# Patient Record
Sex: Male | Born: 1935 | Race: White | Hispanic: No | Smoking: Former smoker
Health system: Southern US, Community
[De-identification: ages and names within clinical notes are randomized; demographics above are authoritative.]

## PROBLEM LIST (undated history)

## (undated) DIAGNOSIS — I10 Essential (primary) hypertension: Secondary | ICD-10-CM

## (undated) DIAGNOSIS — E119 Type 2 diabetes mellitus without complications: Secondary | ICD-10-CM

## (undated) HISTORY — PX: SHOULDER SURGERY: SHX246

---

## 2017-05-13 ENCOUNTER — Emergency Department (INDEPENDENT_AMBULATORY_CARE_PROVIDER_SITE_OTHER)
Admission: EM | Admit: 2017-05-13 | Discharge: 2017-05-13 | Disposition: A | Discharge status: Home / Self Care | Payer: Medicare Other | Source: Home / Self Care

## 2017-05-13 ENCOUNTER — Encounter: Payer: Self-pay | Admitting: Emergency Medicine

## 2017-05-13 ENCOUNTER — Other Ambulatory Visit: Payer: Self-pay

## 2017-05-13 DIAGNOSIS — Z23 Encounter for immunization: Secondary | ICD-10-CM | POA: Diagnosis not present

## 2017-05-13 DIAGNOSIS — S0081XA Abrasion of other part of head, initial encounter: Secondary | ICD-10-CM

## 2017-05-13 DIAGNOSIS — S61210A Laceration without foreign body of right index finger without damage to nail, initial encounter: Secondary | ICD-10-CM

## 2017-05-13 HISTORY — DX: Type 2 diabetes mellitus without complications: E11.9

## 2017-05-13 HISTORY — DX: Essential (primary) hypertension: I10

## 2017-05-13 MED ORDER — TETANUS-DIPHTH-ACELL PERTUSSIS 5-2.5-18.5 LF-MCG/0.5 IM SUSP
0.5000 mL | Freq: Once | INTRAMUSCULAR | Status: AC
  Administered 2017-05-13: 0.5 mL via INTRAMUSCULAR

## 2017-05-13 NOTE — ED Provider Notes (Signed)
[facility]    CSN: [id number] Arrival date & time: 05/13/17  9629     History   Chief Complaint Chief Complaint  Patient presents with  . Laceration    HPI Raymond Morgan is a 84 y.o. male.   About two hours ago patient was pumping up a small cart tire when it exploded.  Debris hit his right cheek and nose, and lacerated the dorsum of his right second finger.  He is not sure when he received his last Tdap.  The history is provided by the patient and a relative.  Laceration  Location:  Finger Finger laceration location:  R index finger Length:  1cm Depth:  Through dermis Bleeding: controlled   Laceration mechanism:  Metal edge Pain details:    Quality:  Aching   Severity:  Mild   Timing:  Constant   Progression:  Improving Foreign body present:  No foreign bodies Ineffective treatments:  None tried Tetanus status:  Out of date Associated symptoms: no focal weakness and no swelling     Past Medical History:  Diagnosis Date  . Diabetes mellitus without complication (HCC)   . Hypertension     There are no active problems to display for this patient.   Past Surgical History:  Procedure Laterality Date  . SHOULDER SURGERY         Home Medications    Prior to Admission medications   Medication Sig Start Date End Date Taking? Authorizing Provider  aspirin 81 MG chewable tablet Chew by mouth daily.   Yes [provider]  atorvastatin (LIPITOR) 10 MG tablet Take 10 mg by mouth daily.   Yes [provider]  glipiZIDE (GLUCOTROL XL) 2.5 MG 24 hr tablet Take 2.5 mg by mouth daily with breakfast.   Yes [provider]  lisinopril (PRINIVIL,ZESTRIL) 40 MG tablet Take 40 mg by mouth daily.   Yes [provider]  magnesium 30 MG tablet Take 30 mg by mouth 2 (two) times daily.   Yes [provider]  meclizine (ANTIVERT) 25 MG tablet Take 25 mg by mouth 3 (three) times daily as needed for dizziness.   Yes [provider]  verapamil (VERELAN PM) 240 MG 24 hr capsule Take 240 mg by mouth at bedtime.   Yes [provider]    Family History No family history on file.  Social History Social History   Tobacco Use  . Smoking status: Former Smoker    Last attempt to quit: 05/14/2007    Years since quitting: 10.0  . Smokeless tobacco: Never Used  Substance Use Topics  . Alcohol use: Yes  . Drug use: Never     Allergies   Patient has no known allergies.   Review of Systems Review of Systems  HENT:       Right facial abrasion and pain.  Eyes: Negative for visual disturbance.  Neurological: Negative for dizziness, focal weakness, light-headedness and headaches.  All other systems reviewed and are negative.    Physical Exam Triage Vital Signs ED Triage Vitals  Enc Vitals Group     BP      Pulse      Resp      Temp      Temp src      SpO2      Weight      Height      Head Circumference      Peak Flow      Pain Score  Pain Loc      Pain Edu?      Excl. in GC?    No data found.  Updated Vital Signs BP (!) 156/68 (BP Location: Right Arm)   Pulse (!) 58   Temp (!) 97.5 F (36.4 C) (Oral)   Ht  (1.702 m)   Wt 173 lb (78.5 kg)   SpO2 99%   BMI 27.10 kg/m   Visual Acuity Right Eye Distance:   Left Eye Distance:   Bilateral Distance:    Right Eye Near:   Left Eye Near:    Bilateral Near:     Physical Exam  Constitutional: He is oriented to person, place, and time. He appears well-developed and well-nourished. No distress.  HENT:  Head: Head is with abrasion. Head is without raccoon's eyes.    Right Ear: External ear normal.  Left Ear: External ear normal.  Nose: Nose normal.  Mouth/Throat: Oropharynx is clear and moist.  Mild abrasion right cheek and nose.  No swelling.  No tenderness over facial bone  Eyes: Pupils are equal, round, and reactive to light. Conjunctivae and EOM are normal.  Neck: Normal range of motion.  Cardiovascular:  Normal rate.  Pulmonary/Chest: Effort normal.  Musculoskeletal:       Right hand: He exhibits laceration. He exhibits normal range of motion, no tenderness, no bony tenderness, normal two-point discrimination, normal capillary refill, no deformity and no swelling. Normal sensation noted. Normal strength noted.       Hands: Right second finger has 1cm simple laceration dorsally over PIP joint.  Finger has full range of motion; flexion/extension intact.  Distal neurovascular function is intact.   Neurological: He is alert and oriented to person, place, and time.  Skin: Skin is warm and dry.  Nursing note and vitals reviewed.    UC Treatments / Results  Labs (all labs ordered are listed, but only abnormal results are displayed) Labs Reviewed - No data to display  EKG None  Radiology No results found.  Procedures Procedures  Laceration Repair Discussed benefits and risks of procedure and verbal consent obtained. Using sterile technique and digital anesthesia with 2% lidocaine without epinephrine, cleansed wound with Betadine followed by copious lavage with normal saline.  Wound carefully inspected for debris and foreign bodies; none found.  Wound closed with # , 5-0 interrupted nylon sutures.  Bacitracin and non-stick sterile dressing applied.  Wound precautions explained to patient.  Return for suture removal in 10 days.   Medications Ordered in UC Medications - No data to display  Initial Impression / Assessment and Plan / UC Course  I have reviewed the triage vital signs and the nursing notes.  Pertinent labs & imaging results that were available during my care of the patient were reviewed by me and considered in my medical decision making (see chart for details).    Administered Tdap. Discussed head injury precautions.   Final Clinical Impressions(s) / UC Diagnoses   Final diagnoses:  Abrasion, face without infection  Laceration of right index finger without foreign body  without damage to nail, initial encounter     Discharge Instructions     Apply ice pack to face for 10 minutes every 2 hours until swelling decreases.  Apply Bacitracin ointment (or other antibiotic ointment) to abrasions face daily until healed.  Change dressing daily and apply Bacitracin ointment to wound.  Keep wound clean and dry.  Return for any signs of infection (or follow-up with family doctor):  Increasing redness,  swelling, pain, heat, drainage, etc. Return in 10 days for suture removal.      ED Prescriptions    None         Lattie Haw, MD 05/20/17 1430

## 2017-05-13 NOTE — Discharge Instructions (Addendum)
Apply ice pack to face for 10 minutes every 2 hours until swelling decreases.  Apply Bacitracin ointment (or other antibiotic ointment) to abrasions face daily until healed.  Change dressing daily and apply Bacitracin ointment to wound.  Keep wound clean and dry.  Return for any signs of infection (or follow-up with family doctor):  Increasing redness, swelling, pain, heat, drainage, etc. Return in 10 days for suture removal.

## 2017-05-13 NOTE — ED Triage Notes (Signed)
Right index finger laceration. Abrasion to nose and right cheek. Pumping a small tire and it blow up cutting top of knuckle and hitting him on the tip of his nose and right cheek

## 2018-11-24 ENCOUNTER — Other Ambulatory Visit: Payer: Self-pay

## 2018-11-24 ENCOUNTER — Emergency Department (INDEPENDENT_AMBULATORY_CARE_PROVIDER_SITE_OTHER)
Admission: EM | Admit: 2018-11-24 | Discharge: 2018-11-24 | Disposition: A | Discharge status: Home / Self Care | Payer: Medicare Other | Source: Home / Self Care | Attending: Family Medicine | Admitting: Family Medicine

## 2018-11-24 ENCOUNTER — Emergency Department (INDEPENDENT_AMBULATORY_CARE_PROVIDER_SITE_OTHER): Payer: Medicare Other

## 2018-11-24 ENCOUNTER — Encounter: Payer: Self-pay | Admitting: Emergency Medicine

## 2018-11-24 DIAGNOSIS — M869 Osteomyelitis, unspecified: Secondary | ICD-10-CM

## 2018-11-24 DIAGNOSIS — M542 Cervicalgia: Secondary | ICD-10-CM

## 2018-11-24 DIAGNOSIS — M62838 Other muscle spasm: Secondary | ICD-10-CM

## 2018-11-24 DIAGNOSIS — R102 Pelvic and perineal pain: Secondary | ICD-10-CM | POA: Diagnosis not present

## 2018-11-24 MED ORDER — PREDNISONE 20 MG PO TABS: ORAL_TABLET | ORAL | 0 refills | Status: AC

## 2018-11-24 MED ORDER — HYDROCODONE-ACETAMINOPHEN 5-325 MG PO TABS: ORAL_TABLET | ORAL | 0 refills | Status: AC

## 2018-11-24 MED ORDER — ACETAMINOPHEN 325 MG PO TABS
975.0000 mg | ORAL_TABLET | Freq: Once | ORAL | Status: AC
  Administered 2018-11-24: 15:00:00 975 mg via ORAL

## 2018-11-24 NOTE — ED Triage Notes (Signed)
Patient is hard of hearing; daughter is with him to help with history. He fell asleep in chair last night and now has pain in left side of neck and shoulder which is significant when he moves head; no pain when still. He took aspirin at 0600. He had influenza vacc this season. He has not travelled or been in contact with COVID positive person.

## 2018-11-24 NOTE — Discharge Instructions (Addendum)
Apply ice pack to neck for 20 to 30 minutes, 3 to 4 times daily  Continue until pain decreases.  Try wearing a soft cervical collar, especially at night.  Begin neck range of motion and stretching exercises as tolerated.

## 2018-11-24 NOTE — ED Provider Notes (Signed)
[facility]    CSN: [id number] Arrival date & time: 11/24/18  1212      History   Chief Complaint Chief Complaint  Patient presents with  . Neck Pain  . Shoulder Pain    HPI Raymond Morgan is a 85 y.o. male.   Patient complains of vague tingling in his right arm and hand for about 4 to 5 days.  Last night he fell asleep in a chair in an awkward position, and today awoke with pain in his left neck worse with any head movement.  He also complains of approximately  6 week history of bilateral groin pain, worse when walking and climbing stairs.  He denies pain or swelling in testicles, and no abdominal pain.  The history is provided by the patient and a relative.    Past Medical History:  Diagnosis Date  . Diabetes mellitus without complication ([place])   . Hypertension     Active problems:  Hyperlipidemia, hypertension, and diabetes   Past Surgical History:  Procedure Laterality Date  . SHOULDER SURGERY         Home Medications    Prior to Admission medications   Medication Sig Start Date End Date Taking? Authorizing Provider  nitroGLYCERIN (NITROSTAT) 0.4 MG SL tablet Place 0.4 mg under the tongue every 5 (five) minutes as needed for chest pain.   Yes [provider]  aspirin 81 MG chewable tablet Chew by mouth daily.    [provider]  atorvastatin (LIPITOR) 10 MG tablet Take 10 mg by mouth daily.    [provider]  glipiZIDE (GLUCOTROL XL) 2.5 MG 24 hr tablet Take 2.5 mg by mouth daily with breakfast.    [provider]  HYDROcodone-acetaminophen (NORCO/VICODIN) 5-325 MG tablet Take one by mouth at bedtime as needed for pain 11/24/18   Kandra Nicolas, MD  lisinopril (PRINIVIL,ZESTRIL) 40 MG tablet Take 40 mg by mouth daily.    [provider]  magnesium 30 MG tablet Take 30 mg by mouth 2 (two) times daily.    [provider]  meclizine (ANTIVERT) 25 MG tablet Take 25 mg by mouth 3 (three) times daily  as needed for dizziness.    [provider]  predniSONE (DELTASONE) 20 MG tablet Take one tab by mouth twice daily for 4 days, then one daily. Take with food. 11/24/18   Kandra Nicolas, MD  verapamil (VERELAN PM) 240 MG 24 hr capsule Take 240 mg by mouth at bedtime.    [provider]    Family History Not recorded by patient  Social History Social History   Tobacco Use  . Smoking status: Former Smoker    Quit date: 05/14/2007    Years since quitting: 11.5  . Smokeless tobacco: Never Used  Substance Use Topics  . Alcohol use: Yes  . Drug use: Never     Allergies   Patient has no known allergies.   Review of Systems Review of Systems  Constitutional: Negative for activity change, appetite change, chills, diaphoresis, fatigue and fever.  HENT: Negative.   Eyes: Negative.   Respiratory: Negative.   Cardiovascular: Negative.   Gastrointestinal: Negative.   Genitourinary: Negative.   Musculoskeletal: Positive for neck pain.       Bilateral groin pain.  Skin: Negative for rash.  Neurological: Negative for tremors, syncope, facial asymmetry, speech difficulty, weakness, numbness and headaches.       Paresthesias right arm.     Physical Exam Triage Vital Signs  ED Triage Vitals  Enc Vitals Group     BP 11/24/18 1259 (!) 152/70     Pulse Rate 11/24/18 1259 77     Resp 11/24/18 1259 16     Temp 11/24/18 1259 97.9 F (36.6 C)     Temp Source 11/24/18 1259 Oral     SpO2 11/24/18 1259 97 %     Weight 11/24/18 1300 175 lb (79.4 kg)     Height 11/24/18 1300 5' 7.5" (1.715 m)     Head Circumference --      Peak Flow --      Pain Score 11/24/18 1300 9     Pain Loc --      Pain Edu? --      Excl. in GC? --    No data found.  Updated Vital Signs BP (!) 152/70 (BP Location: Right Arm)   Pulse 77   Temp 97.9 F (36.6 C) (Oral)   Resp 16   Ht 5' 7.5" (1.715 m)   Wt 79.4 kg   SpO2 97%   BMI 27.00 kg/m   Visual Acuity Right Eye Distance:   Left  Eye Distance:   Bilateral Distance:    Right Eye Near:   Left Eye Near:    Bilateral Near:     Physical Exam Vitals signs and nursing note reviewed.  Constitutional:      General: He is not in acute distress.    Comments: Patient has difficulty hearing.  HENT:     Head: Atraumatic.     Right Ear: External ear normal.     Left Ear: External ear normal.     Nose: Nose normal.     Mouth/Throat:     Pharynx: Oropharynx is clear.  Eyes:     Conjunctiva/sclera: Conjunctivae normal.     Pupils: Pupils are equal, round, and reactive to light.  Neck:     Musculoskeletal: Neck supple. Pain with movement and muscular tenderness present. No crepitus.      Comments: There is tenderness to palpation over left trapezius and sternocleidomastoid muscles. Cardiovascular:     Heart sounds: Normal heart sounds.  Pulmonary:     Breath sounds: Normal breath sounds.  Abdominal:     Tenderness: There is no abdominal tenderness.  Musculoskeletal:       Legs:     Comments: Patient has distinct tenderness over symphysis pubis bilaterally.  Palpation there during resisted lateral adduction, and flexion of hips, recreates his groin pain.  Lymphadenopathy:     Cervical: No cervical adenopathy.  Skin:    General: Skin is warm and dry.  Neurological:     Mental Status: He is alert.      UC Treatments / Results  Labs (all labs ordered are listed, but only abnormal results are displayed) Labs Reviewed - No data to display  EKG   Radiology Dg Cervical Spine Complete  Result Date: 11/24/2018 CLINICAL DATA:  Left-sided neck pain and stiffness since yesterday, worse with movement. No reported injury. EXAM: CERVICAL SPINE - COMPLETE 4+ VIEW COMPARISON:  None. FINDINGS: No fracture or bone lesion. Mild kyphosis, apex at C5. Moderate loss of disc height at C5-C6 with moderate to marked loss of disc height at C6-C7. There are facet degenerative changes, greatest on the right at C4-C5 Soft tissues are  unremarkable. IMPRESSION: 1. No fracture, bone lesion or acute finding. 2. Disc and facet degenerative changes. Electronically Signed   By: Amie Portlandavid  Ormond M.D.   On: 11/24/2018 14:17  Dg Pelvis 1-2 Views  Result Date: 11/24/2018 CLINICAL DATA:  Bilateral groin pain for approximately 6 weeks. No reported trauma. EXAM: PELVIS - 1-2 VIEW COMPARISON:  None. FINDINGS: No fracture or bone lesion. Hip joints, SI joints and symphysis pubis are normally spaced and aligned. No significant arthropathic change. Soft tissues are unremarkable. IMPRESSION: Negative. Electronically Signed   By: Amie Portland M.D.   On: 11/24/2018 14:15    Procedures Procedures (including critical care time)  Medications Ordered in UC Medications  acetaminophen (TYLENOL) tablet 975 mg (has no administration in time range)    Initial Impression / Assessment and Plan / UC Course  I have reviewed the triage vital signs and the nursing notes.  Pertinent labs & imaging results that were available during my care of the patient were reviewed by me and considered in my medical decision making (see chart for details).    Suspect right cervical radiculopathy, and acute left neck spasm.  Begin prednisone burst/taper.  Rx Lortab, one at bedtime PRN (Rx #5, no refill). Controlled Substance Prescriptions I have consulted the [place] Controlled Substances Registry for this patient, and feel the risk/benefit ratio today is favorable for proceeding with this prescription for a controlled substance.   Followup with Dr. [facility] ([facility]) for further management.  Final Clinical Impressions(s) / UC Diagnoses   Final diagnoses:  Neck muscle spasm  Osteitis pubis (HCC)     Discharge Instructions     Apply ice pack to neck for 20 to 30 minutes, 3 to 4 times daily  Continue until pain decreases.  Try wearing a soft cervical collar, especially at night.  Begin neck range of motion and stretching exercises as  tolerated.    ED Prescriptions    Medication Sig Dispense Auth. Provider   predniSONE (DELTASONE) 20 MG tablet Take one tab by mouth twice daily for 4 days, then one daily. Take with food. 12 tablet Lattie Haw, MD   HYDROcodone-acetaminophen (NORCO/VICODIN) 5-325 MG tablet Take one by mouth at bedtime as needed for pain 5 tablet Cathren Harsh Tera Mater, MD        Lattie Haw, MD 11/26/18 2139

## 2019-11-02 ENCOUNTER — Other Ambulatory Visit (HOSPITAL_BASED_OUTPATIENT_CLINIC_OR_DEPARTMENT_OTHER): Payer: Self-pay | Admitting: Internal Medicine

## 2019-11-02 ENCOUNTER — Ambulatory Visit: Payer: PRIVATE HEALTH INSURANCE | Attending: Internal Medicine

## 2019-11-02 DIAGNOSIS — Z23 Encounter for immunization: Secondary | ICD-10-CM

## 2019-11-02 NOTE — Progress Notes (Signed)
   Covid-19 Vaccination Clinic  Name:  Raymond Morgan    MRN: [id number] DOB: May 10, 1932  11/02/2019  [name] was observed post Covid-19 immunization for 15 minutes without incident. He was provided with Vaccine Information Sheet and instruction to access the V-Safe system.   [name] was instructed to call 911 with any severe reactions post vaccine: Marland Kitchen Difficulty breathing  . Swelling of face and throat  . A fast heartbeat  . A bad rash all over body  . Dizziness and weakness

## 2019-11-06 MED FILL — PFIZER-BIONTECH COVID-19 VA: 30 | 1 days supply | Qty: 0 | Fill #0

## 2019-12-21 ENCOUNTER — Other Ambulatory Visit (HOSPITAL_BASED_OUTPATIENT_CLINIC_OR_DEPARTMENT_OTHER): Payer: Self-pay | Admitting: Internal Medicine

## 2019-12-21 ENCOUNTER — Ambulatory Visit: Payer: PRIVATE HEALTH INSURANCE | Attending: Internal Medicine

## 2019-12-21 DIAGNOSIS — Z23 Encounter for immunization: Secondary | ICD-10-CM

## 2019-12-21 NOTE — Progress Notes (Signed)
   Covid-19 Vaccination Clinic  Name:  Deaglan Lile    MRN: [id number] DOB: 1932-02-17  12/21/2019  Mr. Stoney was observed post Covid-19 immunization for 15 minutes without incident. He was provided with Vaccine Information Sheet and instruction to access the V-Safe system.   Mr. Lyon was instructed to call 911 with any severe reactions post vaccine: Marland Kitchen Difficulty breathing  . Swelling of face and throat  . A fast heartbeat  . A bad rash all over body  . Dizziness and weakness   Immunizations Administered    Name Date Dose VIS Date Route   Pfizer COVID-19 Vaccine 12/21/2019  1:35 PM 0.3 mL 10/24/2019 Intramuscular   Manufacturer: [employer], [employer]   Lot: [id number]   NDC: [id number]

## 2019-12-24 MED FILL — PFIZER-BIONTECH COVID-19 VA: 30 | 1 days supply | Qty: 0 | Fill #0

## 2020-04-29 ENCOUNTER — Ambulatory Visit: Payer: PRIVATE HEALTH INSURANCE | Attending: Internal Medicine

## 2020-04-29 DIAGNOSIS — Z23 Encounter for immunization: Secondary | ICD-10-CM

## 2020-04-29 NOTE — Progress Notes (Signed)
   Covid-19 Vaccination Clinic  Name:  Raymond Morgan    MRN: [id number] DOB: 03/20/1932  04/29/2020  Mr. Raymond Morgan was observed post Covid-19 immunization for 15 minutes without incident. He was provided with Vaccine Information Sheet and instruction to access the V-Safe system.   Mr. Raymond Morgan was instructed to call 911 with any severe reactions post vaccine: Marland Kitchen Difficulty breathing  . Swelling of face and throat  . A fast heartbeat  . A bad rash all over body  . Dizziness and weakness   Immunizations Administered    Name Date Dose VIS Date Route   PFIZER Comrnaty(Gray TOP) Covid-19 Vaccine 04/29/2020 10:20 AM 0.3 mL 12/13/2019 Intramuscular   Manufacturer: [employer], [employer]   Lot: [id number]   NDC: [phone]

## 2020-05-01 ENCOUNTER — Other Ambulatory Visit (HOSPITAL_BASED_OUTPATIENT_CLINIC_OR_DEPARTMENT_OTHER): Payer: Self-pay

## 2020-05-01 MED ORDER — PFIZER-BIONT COVID-19 VAC-TRIS 30 MCG/0.3ML IM SUSP
INTRAMUSCULAR | 0 refills | Status: AC
  Filled 2020-05-01: qty 0.3, 1d supply, fill #0

## 2020-05-02 ENCOUNTER — Other Ambulatory Visit (HOSPITAL_BASED_OUTPATIENT_CLINIC_OR_DEPARTMENT_OTHER): Payer: Self-pay

## 2020-08-18 IMAGING — DX DG PELVIS 1-2V
2 series · 2 of 2 positions shown · non-contrast
Comparison: None.

CLINICAL DATA: Bilateral groin pain for approximately 6 weeks. No
reported trauma.

EXAM:
PELVIS - 1-2 VIEW

[pelvis ap (1 of 2)]
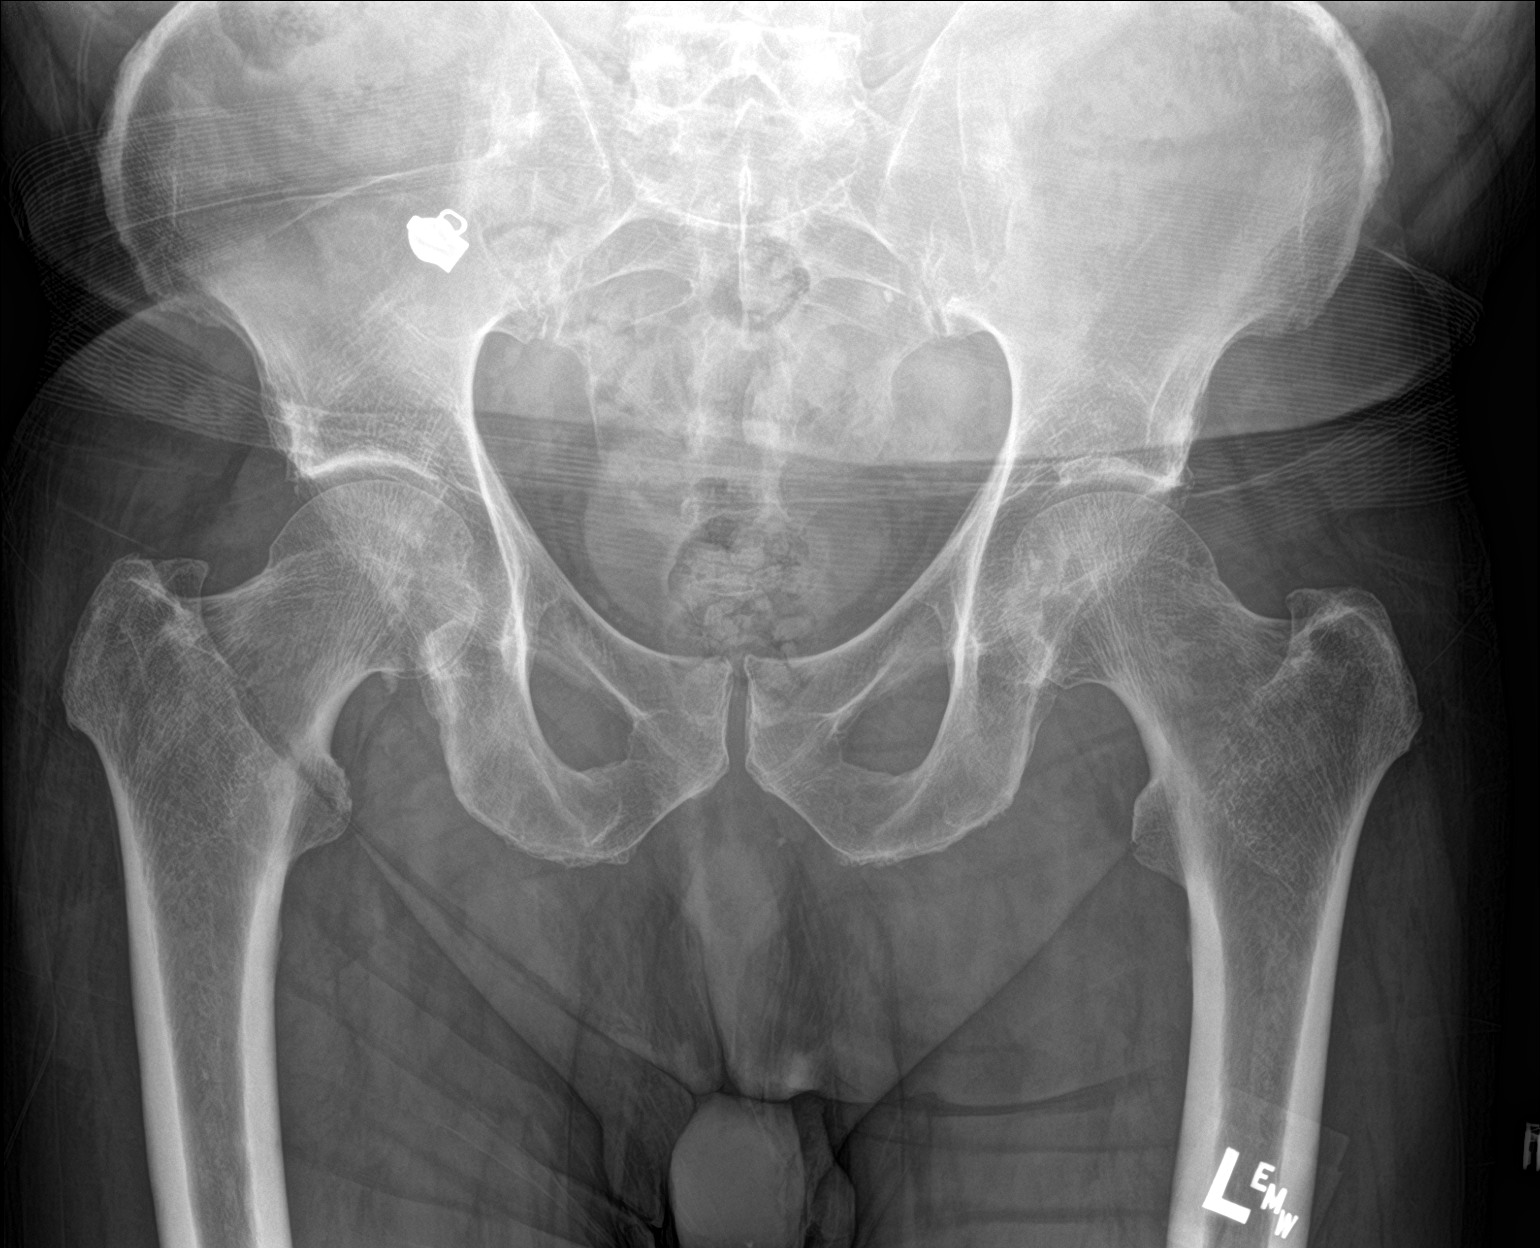

[pelvis ap (2 of 2)]
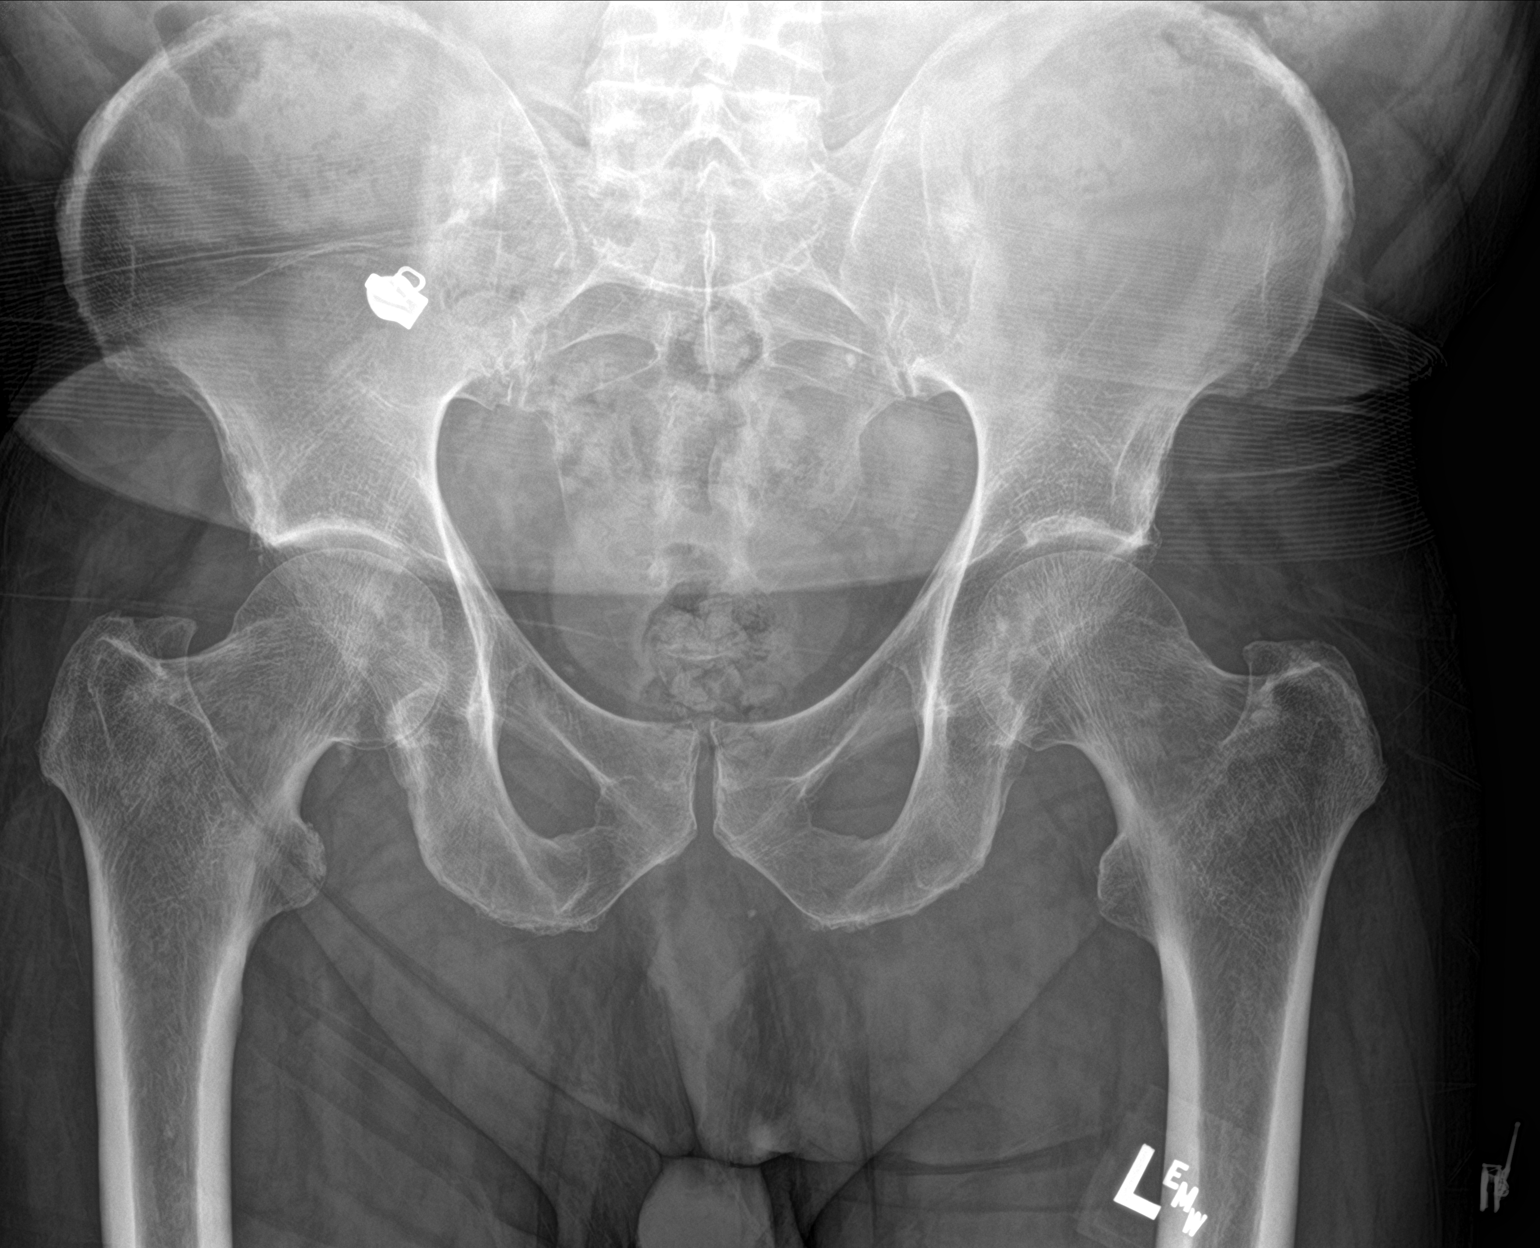

[2 of 2 positions shown; findings below may reference images not displayed]

FINDINGS: No fracture or bone lesion.

Hip joints, SI joints and symphysis pubis are normally spaced and
aligned. No significant arthropathic change.

Soft tissues are unremarkable.
IMPRESSION: Negative.

## 2020-08-18 IMAGING — DX DG CERVICAL SPINE COMPLETE 4+V
8 series · 8 of 8 positions shown · non-contrast
Comparison: None.

CLINICAL DATA: Left-sided neck pain and stiffness since yesterday,
worse with movement. No reported injury.

EXAM:
CERVICAL SPINE - COMPLETE 4+ VIEW

[c-spine lat]
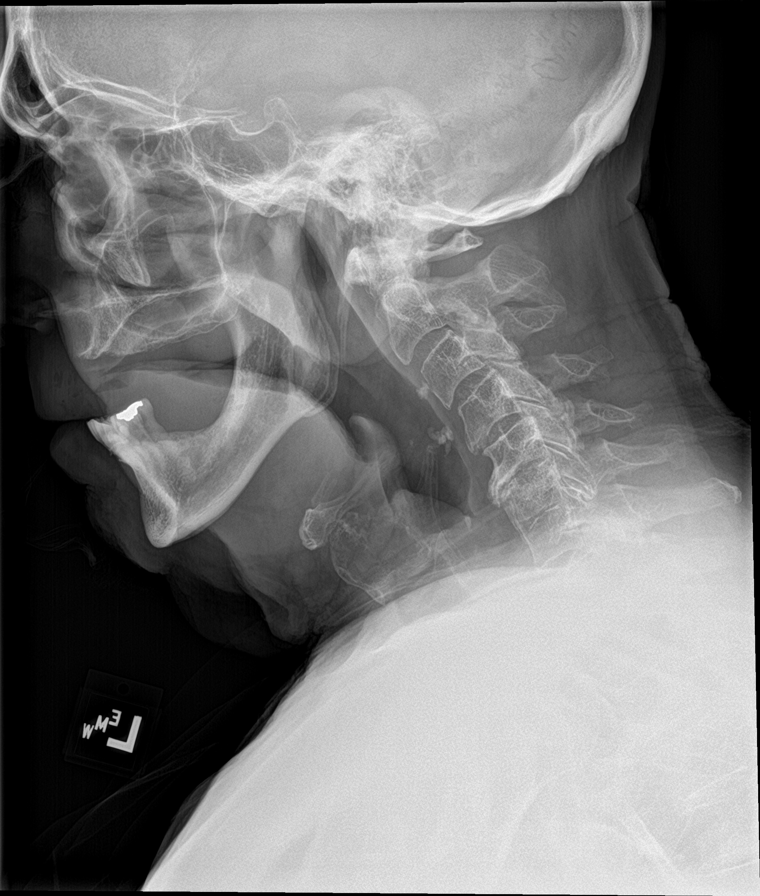

[c-spine obl (1 of 3)]
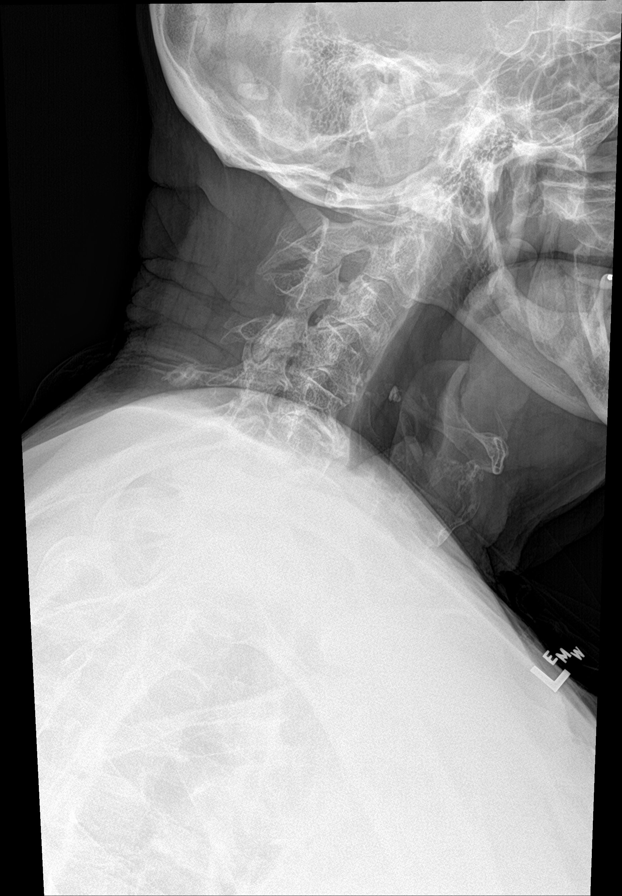

[c-spine obl (2 of 3)]
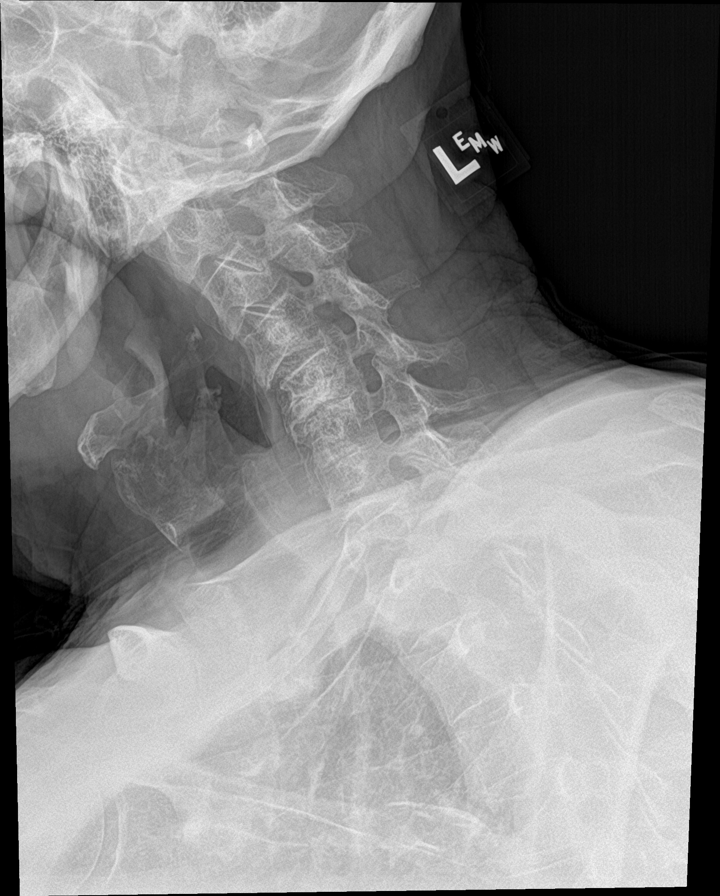

[c-spine ap]
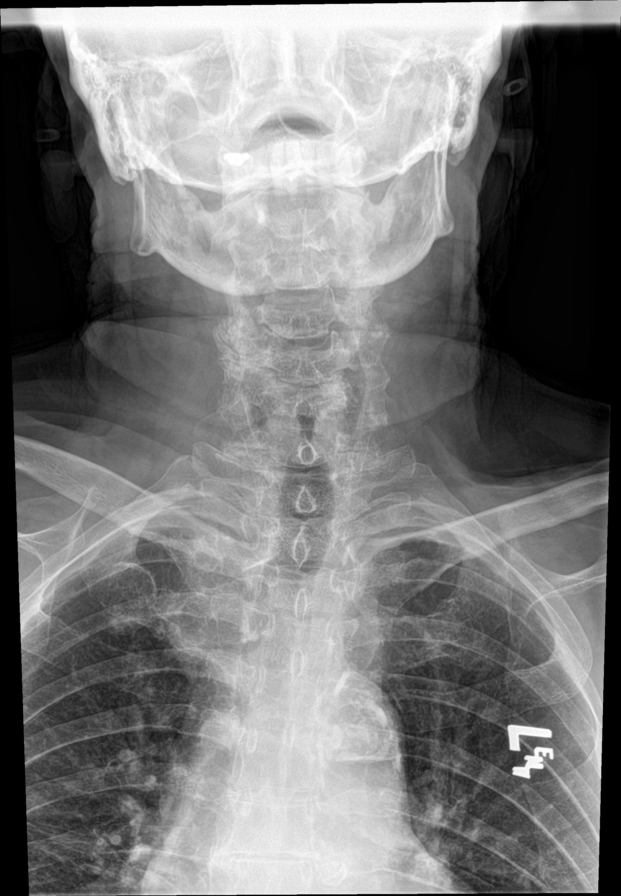

[c-spine open mouth]
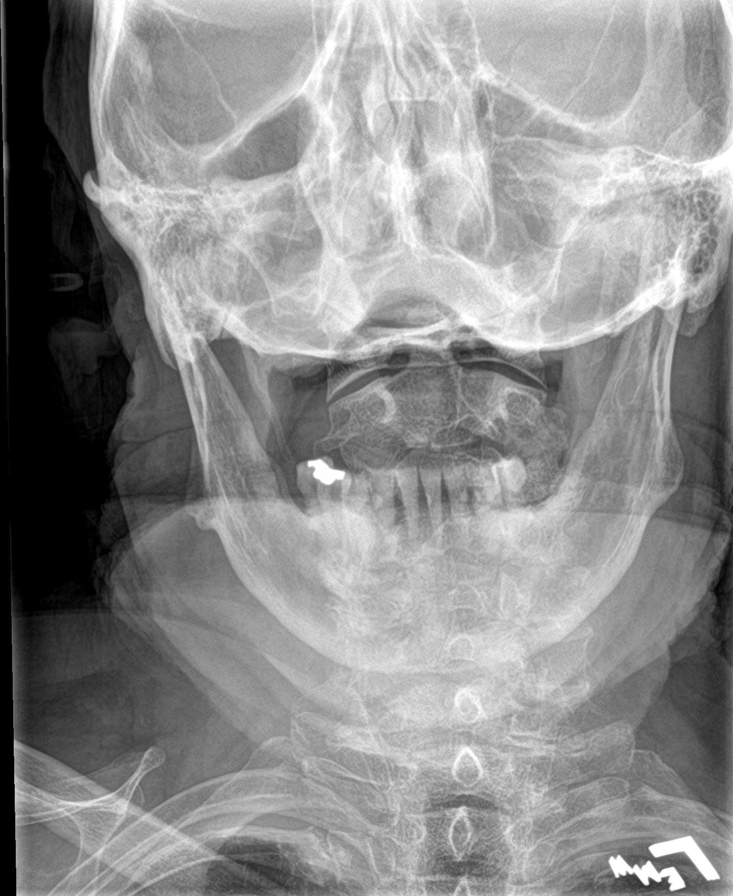

[c-spine swimmers]
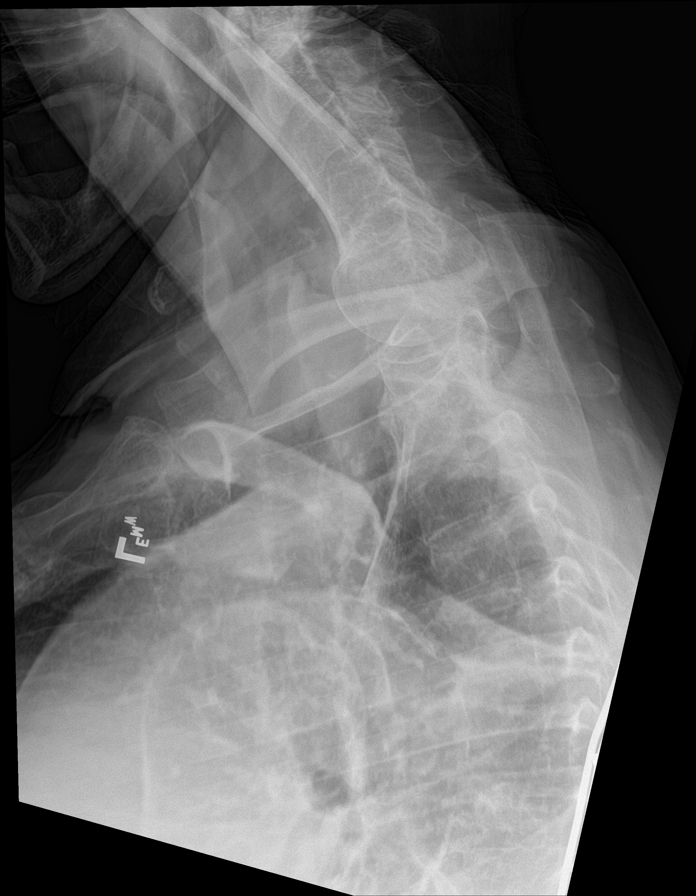

[[person_name]]
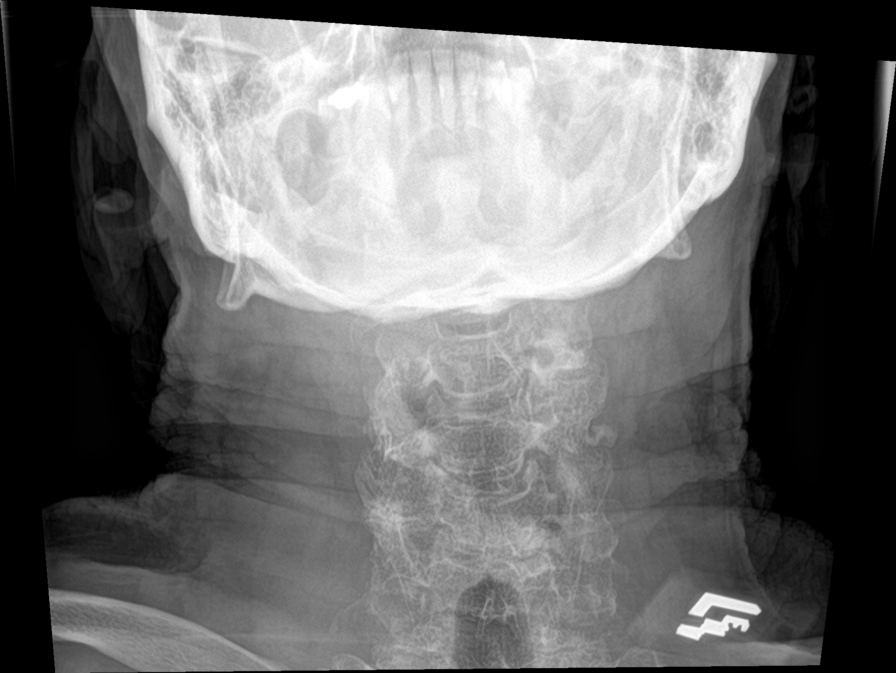

[c-spine obl (3 of 3)]
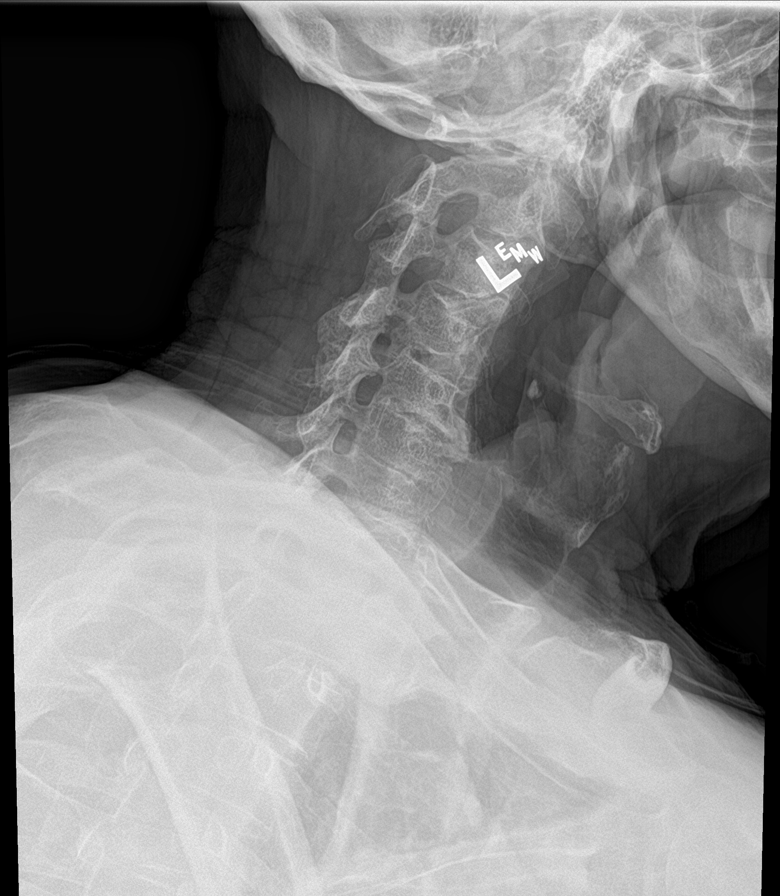

[8 of 8 positions shown; findings below may reference images not displayed]

FINDINGS: No fracture or bone lesion. Mild kyphosis, apex at C5. Moderate loss
of disc height at C5-C6 with moderate to marked loss of disc height
at C6-C7. There are facet degenerative changes, greatest on the
right at C4-C5

Soft tissues are unremarkable.
IMPRESSION: 1. No fracture, bone lesion or acute finding.
2. Disc and facet degenerative changes.

## 2020-09-09 ENCOUNTER — Other Ambulatory Visit (HOSPITAL_BASED_OUTPATIENT_CLINIC_OR_DEPARTMENT_OTHER): Payer: Self-pay

## 2020-10-06 ENCOUNTER — Ambulatory Visit: Payer: PRIVATE HEALTH INSURANCE | Attending: Internal Medicine

## 2020-10-06 ENCOUNTER — Other Ambulatory Visit (HOSPITAL_BASED_OUTPATIENT_CLINIC_OR_DEPARTMENT_OTHER): Payer: Self-pay

## 2020-10-06 DIAGNOSIS — Z23 Encounter for immunization: Secondary | ICD-10-CM

## 2020-10-06 MED ORDER — INFLUENZA VAC A&B SA ADJ QUAD 0.5 ML IM PRSY
PREFILLED_SYRINGE | INTRAMUSCULAR | 0 refills | Status: AC
  Filled 2020-10-06: qty 0.5, 1d supply, fill #0

## 2020-10-06 NOTE — Progress Notes (Signed)
   Covid-19 Vaccination Clinic  Name:  Trase Bunda    MRN: [id number] DOB: [birth year]  10/06/2020  Mr. Trimpe was observed post Covid-19 immunization for 15 minutes without incident. He was provided with Vaccine Information Sheet and instruction to access the V-Safe system.   Mr. Ducksworth was instructed to call 911 with any severe reactions post vaccine: Difficulty breathing  Swelling of face and throat  A fast heartbeat  A bad rash all over body  Dizziness and weakness

## 2020-10-14 ENCOUNTER — Other Ambulatory Visit (HOSPITAL_BASED_OUTPATIENT_CLINIC_OR_DEPARTMENT_OTHER): Payer: Self-pay

## 2020-10-14 MED ORDER — COVID-19MRNA BIVAL VACC PFIZER 30 MCG/0.3ML IM SUSP
INTRAMUSCULAR | 0 refills | Status: AC
  Filled 2020-10-14: qty 0.3, 1d supply, fill #0

## 2021-03-04 DEATH — deceased
